# Patient Record
Sex: Female | Born: 1965 | Hispanic: No | Marital: Single | Smoking: Never smoker
Health system: Southern US, Community
[De-identification: ages and names within clinical notes are randomized; demographics above are authoritative.]

## PROBLEM LIST (undated history)

## (undated) DIAGNOSIS — Z780 Asymptomatic menopausal state: Secondary | ICD-10-CM

## (undated) HISTORY — PX: KNEE ARTHROSCOPY: SHX127

---

## 2010-07-23 ENCOUNTER — Ambulatory Visit: Payer: Self-pay | Admitting: Family Medicine

## 2010-07-23 DIAGNOSIS — M7710 Lateral epicondylitis, unspecified elbow: Secondary | ICD-10-CM | POA: Insufficient documentation

## 2010-07-23 DIAGNOSIS — K2970 Gastritis, unspecified, without bleeding: Secondary | ICD-10-CM | POA: Insufficient documentation

## 2010-07-23 DIAGNOSIS — K2990 Gastroduodenitis, unspecified, without bleeding: Secondary | ICD-10-CM

## 2010-07-24 ENCOUNTER — Encounter: Payer: Self-pay | Admitting: Family Medicine

## 2010-07-24 LAB — CONVERTED CEMR LAB
ALT: 11 units/L (ref 0–35)
AST: 15 units/L (ref 0–37)
Albumin: 3.6 g/dL (ref 3.5–5.2)
Alkaline Phosphatase: 38 units/L — ABNORMAL LOW (ref 39–117)
Basophils Absolute: 0 10*3/uL (ref 0.0–0.1)
Basophils Relative: 1 % (ref 0–1)
Calcium: 8.4 mg/dL (ref 8.4–10.5)
Chloride: 106 meq/L (ref 96–112)
Eosinophils Absolute: 0.1 10*3/uL (ref 0.0–0.7)
HCT: 40.5 % (ref 36.0–46.0)
Hemoglobin: 13.1 g/dL (ref 12.0–15.0)
Potassium: 4.7 meq/L (ref 3.5–5.3)
RDW: 12.9 % (ref 11.5–15.5)
Sodium: 140 meq/L (ref 135–145)
Total Protein: 6 g/dL (ref 6.0–8.3)
Vit D, 25-Hydroxy: 47 ng/mL (ref 30–89)

## 2010-08-11 ENCOUNTER — Encounter: Payer: Self-pay | Admitting: Family Medicine

## 2010-10-21 NOTE — Letter (Signed)
Summary: Annual Exam-[facility]  Annual Exam-[facility]   Imported By: Maryln Gottron 08/25/2010 14:36:31  _____________________________________________________________________  External Attachment:    Type:   Image     Comment:   External Document

## 2010-10-21 NOTE — Assessment & Plan Note (Signed)
Summary: NOV: Gastritis, Lateral epicondyltis   Vital Signs:  Patient profile:   45 year old female Height:      62 inches Weight:      116 pounds Temp:     98.8 degrees F oral Pulse rate:   94 / minute BP sitting:   112 / 60  (right arm) Cuff size:   regular  Vitals Entered By: Avon Gully CMA, Duncan Dull) (July 23, 2010 9:08 AM) CC: NP ---abd pain   CC:  NP ---abd pain.  History of Present Illness: Had "stomach pain " in July right before went for a cruise. Then it went away on its own.  Pain recurred about 2 days ago. Feels like an "ache".  Avoid MSG, caffeine, et because of her HA.  Occ eats spicey foods.  After ate a pancake had sharp "gashing" pains.  Then changed to a BRAT diet.  Felt nauseated the first day.  Nomral BMs, no blood in teh stool.  Pain is epigastric but no radiation. Occ will notice a burn in that area.  Does have a high tolerance for pain. Tried Pepto -bismol and didn't help.  Tried a Ginger pill and helped some.  No fever.    Habits & Providers  Alcohol-Tobacco-Diet     Alcohol drinks/day: 0     Tobacco Status: never  Exercise-Depression-Behavior     Does Patient Exercise: yes     Type of exercise: free wt lifter     Times/week: 3     STD Risk: never     Drug Use: no     Seat Belt Use: always  Current Medications (verified): 1)  Ortho Tri-Cyclen (28) 0.18/0.215/0.25 Mg-35 Mcg Tabs (Norgestim-Eth Estrad Triphasic) .... Take One Tablet By Mouth Once A Day  Allergies (verified): No Known Drug Allergies  Comments:  Nurse/Medical Assistant: The patient's medications and allergies were reviewed with the patient and were updated in the Medication and Allergy Lists. Avon Gully CMA, Duncan Dull) (July 23, 2010 9:11 AM)  Past History:  Past Medical History: None  Past Surgical History: None  Family History: Uncle with alcoholism Father wtih hi cho, HTN  Social History: Never Smoked Alcohol use-no Drug use-no Regular  exercise-yes Smoking Status:  never Does Patient Exercise:  yes STD Risk:  never Drug Use:  no Seat Belt Use:  always  Review of Systems       No fever/sweats/weakness, unexplained weight loss/gain.  No vison changes.  No difficulty hearing/ringing in ears, hay fever/allergies.  No chest pain/discomfort, palpitations.  No Br lump/nipple discharge.  No cough/wheeze.  No blood in BM, nausea/vomiting/diarrhea.  No nighttime urination, leaking urine, unusual vaginal bleeding, discharge (penis or vagina).  No muscle/joint pain. No rash, change in mole.  No HA, memory loss.  No anxiety, sleep d/o, depression.  No easy bruising/bleeding, unexplained lump   Physical Exam  General:  Well-developed,well-nourished,in no acute distress; alert,appropriate and cooperative throughout examination Lungs:  Normal respiratory effort, chest expands symmetrically. Lungs are clear to auscultation, no crackles or wheezes. Heart:  Normal rate and regular rhythm. S1 and S2 normal without gallop, murmur, click, rub or other extra sounds. Abdomen:  soft, normal bowel sounds, no distention, no hepatomegaly, and no splenomegaly.  Tender in the right upper and left upper quadrant and the left lower quadrant.  Psych:  Cognition and judgment appear intact. Alert and cooperative with normal attention span and concentration. No apparent delusions, illusions, hallucinations   Impression & Recommendations:  Problem # 1:  GASTRITIS (ICD-535.50)  Orders: T-Comprehensive Metabolic Panel [id number]) T-CBC w/Diff [id number]) T-Amylase [phone]) T-Lipase [id number])  Problem # 2:  LATERAL EPICONDYLITIS (ICD-726.32)  Complete Medication List: 1)  Ortho Tri-cyclen (28) 0.18/0.215/0.25 Mg-35 Mcg Tabs (Norgestim-eth estrad triphasic) .... Take one tablet by mouth once a day  Patient Instructions: 1)  See if the dexilant samples hep. Take them once a day in the AM. If working well then can change to the to Prilosec  OTC and Prevacid OTC.  2)  Call me Monday and let me know if better. 3)  Avoid spicey, greasy foods, and acidic foods.  Avoid Advile and Aleve products.     Orders Added: 1)  T-Comprehensive Metabolic Panel [[id number]] 2)  T-CBC w/Diff [[id number]] 3)  T-Amylase [[id number]] 4)  T-Lipase [[id number]]  Appended Document: NOV: Gastritis, Lateral epicondyltis   Appended Document: NOV: Gastritis, Lateral epicondyltis   Preventive Care Screening  Mammogram:    Date:  09/21/2009    Next Due:  09/2010    Results:  normal  Dexa Interp:    Next Due:  08/2020  Pap Smear:    Date:  08/11/2010    Next Due:  08/2012    Results:  normal

## 2010-12-08 ENCOUNTER — Encounter: Payer: Self-pay | Admitting: Family Medicine

## 2010-12-10 ENCOUNTER — Ambulatory Visit (INDEPENDENT_AMBULATORY_CARE_PROVIDER_SITE_OTHER): Payer: No Typology Code available for payment source | Admitting: Family Medicine

## 2010-12-10 VITALS — BP 97/60 | HR 51 | Ht 62.75 in | Wt 111.0 lb

## 2010-12-10 DIAGNOSIS — F411 Generalized anxiety disorder: Secondary | ICD-10-CM | POA: Insufficient documentation

## 2010-12-10 MED ORDER — FLUOXETINE HCL 20 MG PO CAPS: ORAL_CAPSULE | ORAL | Status: DC

## 2010-12-10 NOTE — Progress Notes (Signed)
  Subjective:    Patient ID: Angela Floyd, female    DOB: September 13, 1966, 45 y.o.   MRN: [id number]  Anxiety Presents for initial visit. Episode onset: 2 months. The problem has been gradually worsening.    Son was cussed out at school back in early January.  Son has recorded the conversation.  They approached the school about this. Moved her son out of school.  Went to the newspapers about it b/c no response from teh school.  Son received threats form his friends. 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  She is not sleeping or eating.  Mind is rushing. Waking up at night with her palms or sweaty.  Wants to know if anything natural that might help her.  Her husband is a quadriplegic and was on Depo for year. She actually felt this helped keep her mood even keel. Has been irritable at work. She usually get regular exercise, just has been exhausted because not sleeping well.  She has a lot of guilt about leaving her husband.      Review of Systems     Objective:   Physical Exam  Constitutional: She appears well-developed and well-nourished.  Cardiovascular: Normal rate, regular rhythm and normal heart sounds.   Pulmonary/Chest: Effort normal and breath sounds normal.          Assessment & Plan:

## 2010-12-10 NOTE — Assessment & Plan Note (Addendum)
Has acute situational stressors but has some long term issues. Discused options of counseling and regular exercise. Also dicussed medications as an options. Will start and SSRI. Discussed potential SE. Pt aware. Will make referral for counseling. F/U in 3-4 weeks.

## 2010-12-10 NOTE — Patient Instructions (Signed)
Start with half a tab once a day, then increase to whole tab daily Call me if you have any problems.

## 2010-12-24 ENCOUNTER — Ambulatory Visit (INDEPENDENT_AMBULATORY_CARE_PROVIDER_SITE_OTHER): Payer: No Typology Code available for payment source | Admitting: Licensed Clinical Social Worker

## 2010-12-24 DIAGNOSIS — F4322 Adjustment disorder with anxiety: Secondary | ICD-10-CM

## 2011-01-02 ENCOUNTER — Ambulatory Visit: Payer: Self-pay | Admitting: Family Medicine

## 2011-01-09 ENCOUNTER — Encounter (INDEPENDENT_AMBULATORY_CARE_PROVIDER_SITE_OTHER): Payer: No Typology Code available for payment source | Admitting: Licensed Clinical Social Worker

## 2011-01-09 DIAGNOSIS — F4322 Adjustment disorder with anxiety: Secondary | ICD-10-CM

## 2011-01-21 ENCOUNTER — Encounter (HOSPITAL_COMMUNITY): Payer: Self-pay | Admitting: Licensed Clinical Social Worker

## 2011-02-04 ENCOUNTER — Encounter (INDEPENDENT_AMBULATORY_CARE_PROVIDER_SITE_OTHER): Payer: No Typology Code available for payment source | Admitting: Licensed Clinical Social Worker

## 2011-02-04 DIAGNOSIS — F4323 Adjustment disorder with mixed anxiety and depressed mood: Secondary | ICD-10-CM

## 2011-04-20 ENCOUNTER — Encounter: Payer: Self-pay | Admitting: Emergency Medicine

## 2011-04-20 ENCOUNTER — Inpatient Hospital Stay (INDEPENDENT_AMBULATORY_CARE_PROVIDER_SITE_OTHER)
Admission: RE | Admit: 2011-04-20 | Discharge: 2011-04-20 | Disposition: A | Discharge status: Home / Self Care | Payer: No Typology Code available for payment source | Source: Ambulatory Visit | Attending: Emergency Medicine | Admitting: Emergency Medicine

## 2011-04-20 DIAGNOSIS — N390 Urinary tract infection, site not specified: Secondary | ICD-10-CM

## 2011-04-20 LAB — CONVERTED CEMR LAB
Bilirubin Urine: NEGATIVE
Glucose, Urine, Semiquant: NEGATIVE
Ketones, urine, test strip: NEGATIVE
Specific Gravity, Urine: 1.01
Urobilinogen, UA: 0.2

## 2011-04-23 ENCOUNTER — Telehealth (INDEPENDENT_AMBULATORY_CARE_PROVIDER_SITE_OTHER): Payer: Self-pay | Admitting: Emergency Medicine

## 2011-06-05 ENCOUNTER — Inpatient Hospital Stay (INDEPENDENT_AMBULATORY_CARE_PROVIDER_SITE_OTHER)
Admission: RE | Admit: 2011-06-05 | Discharge: 2011-06-05 | Disposition: A | Discharge status: Home / Self Care | Payer: No Typology Code available for payment source | Source: Ambulatory Visit | Attending: Emergency Medicine | Admitting: Emergency Medicine

## 2011-06-05 ENCOUNTER — Encounter: Payer: Self-pay | Admitting: Emergency Medicine

## 2011-06-05 DIAGNOSIS — J029 Acute pharyngitis, unspecified: Secondary | ICD-10-CM | POA: Insufficient documentation

## 2011-06-05 DIAGNOSIS — J040 Acute laryngitis: Secondary | ICD-10-CM | POA: Insufficient documentation

## 2011-08-24 NOTE — Telephone Encounter (Signed)
  Phone Note Outgoing Call Call back at Home Phone [phone] [facility]     Call placed by: Emilio Math,  April 23, 2011 11:12 AM Call placed to: Patient Summary of Call: Patient has been drinking a lot of water using AZO and is asymptomatic.  Advised her that the culture was negative.

## 2011-08-24 NOTE — Progress Notes (Signed)
Summary: ?uti Room 5   Vital Signs:  Patient Profile:   45 Years Old Female CC:      Polyuria, dysuria x 5 days LMP:     03/30/2011 Height:     62 inches Weight:      115 pounds O2 Sat:      99 % O2 treatment:    Room Air Temp:     98.4 degrees F oral Pulse rate:   66 / minute Pulse rhythm:   regular Resp:     14 per minute BP sitting:   92 / 56  (left arm) Cuff size:   regular  Vitals Entered By: Emilio Math (April 20, 2011 8:26 AM)  Menstrual History: LMP (date): 03/30/2011                  Current Allergies: No known allergies History of Present Illness History from: patient Chief Complaint: Polyuria, dysuria x 5 days History of Present Illness: 45 Years Old Female complains of UTI symptoms for 2 days.  She describes the pain as burning during urination.  She has not used any OTC meds. + dysuria + frequency + urgency No hematuria No vaginal discharge No fever/chills No lower abdomenal pain No back pain No fatigue   REVIEW OF SYSTEMS Constitutional Symptoms      Denies fever, chills, night sweats, weight loss, weight gain, and fatigue.  Eyes       Denies change in vision, eye pain, eye discharge, glasses, contact lenses, and eye surgery. Ear/Nose/Throat/Mouth       Denies hearing loss/aids, change in hearing, ear pain, ear discharge, dizziness, frequent runny nose, frequent nose bleeds, sinus problems, sore throat, hoarseness, and tooth pain or bleeding.  Respiratory       Denies dry cough, productive cough, wheezing, shortness of breath, asthma, bronchitis, and emphysema/COPD.  Cardiovascular       Denies murmurs, chest pain, and tires easily with exhertion.    Gastrointestinal       Denies stomach pain, nausea/vomiting, diarrhea, constipation, blood in bowel movements, and indigestion. Genitourniary       Complains of painful urination.      Denies kidney stones and loss of urinary control. Neurological       Denies paralysis, seizures, and  fainting/blackouts. Musculoskeletal       Denies muscle pain, joint pain, joint stiffness, decreased range of motion, redness, swelling, muscle weakness, and gout.  Skin       Denies bruising, unusual mles/lumps or sores, and hair/skin or nail changes.  Psych       Denies mood changes, temper/anger issues, anxiety/stress, speech problems, depression, and sleep problems.  Past History:  Past Medical History: Reviewed history from 07/23/2010 and no changes required. None  Past Surgical History: Reviewed history from 07/23/2010 and no changes required. None  Family History: Reviewed history from 07/23/2010 and no changes required. Uncle with alcoholism Father wtih hi cho, HTN  Social History: Reviewed history from 07/23/2010 and no changes required. Never Smoked Alcohol use-no Drug use-no Regular exercise-yes Physical Exam General appearance: well developed, well nourished, no acute distress Abdomen: soft, non-tender without obvious organomegaly Back: no flank of CVA tenderness MSE: oriented to time, place, and person Assessment New Problems: URINARY TRACT INFECTION (ICD-599.0)   Patient Education: Patient and/or caregiver instructed in the following: rest fluids and Tylenol.  Plan New Medications/Changes: CIPROFLOXACIN HCL 250 MG TABS (CIPROFLOXACIN HCL) 1 by mouth two times a day for 5 days  #10 x 0, 04/20/2011,  Hoyt Koch MD  New Orders: New Patient Level III [phone] UA Dipstick w/o Micro (automated)  [81003] T-Culture, Urine [id number] Planning Comments:   Urine culture pending.  Pt states that she may hold off on the antibiotics for a day or two and see if OTC Azo and hydration work since she is concerned with the birth control interaction.  Follow-up with your primary care physician if not improving or if getting worse.   The patient and/or caregiver has been counseled thoroughly with regard to medications prescribed including dosage, schedule,  interactions, rationale for use, and possible side effects and they verbalize understanding.  Diagnoses and expected course of recovery discussed and will return if not improved as expected or if the condition worsens. Patient and/or caregiver verbalized understanding.  Prescriptions: CIPROFLOXACIN HCL 250 MG TABS (CIPROFLOXACIN HCL) 1 by mouth two times a day for 5 days  #10 x 0   Entered and Authorized by:   Hoyt Koch MD   Signed by:   Hoyt Koch MD on 04/20/2011   Method used:   Print then Give to Patient   RxID:   [id number]   Orders Added: 1)  New Patient Level III [95621] 2)  UA Dipstick w/o Micro (automated)  [81003] 3)  T-Culture, Urine [[id number]]    Laboratory Results   Urine Tests  Date/Time Received: April 20, 2011 8:32 AM  Date/Time Reported: April 20, 2011 8:32 AM   Routine Urinalysis   Color: yellow Appearance: Clear Glucose: negative   (Normal Range: Negative) Bilirubin: negative   (Normal Range: Negative) Ketone: negative   (Normal Range: Negative) Spec. Gravity: 1.010   (Normal Range: 1.003-1.035) Blood: 2+   (Normal Range: Negative) pH: 6.0   (Normal Range: 5.0-8.0) Protein: negative   (Normal Range: Negative) Urobilinogen: 0.2   (Normal Range: 0-1) Nitrite: negative   (Normal Range: Negative) Leukocyte Esterace: 1+   (Normal Range: Negative)

## 2011-08-24 NOTE — Progress Notes (Signed)
Summary: severe sore throat rm 2   Vital Signs:  Patient Profile:   45 Years Old Female CC:      HA, hoarseness, teeth and face hurts x 5 days Height:     62 inches Weight:      115.75 pounds O2 Sat:      99 % O2 treatment:    Room Air Temp:     98.8 degrees F oral Pulse rate:   69 / minute Resp:     16 per minute BP sitting:   99 / 62  (left arm) Cuff size:   regular  Vitals Entered By: Clemens Catholic LPN (June 05, 2011 10:10 AM)                  Updated Prior Medication List: ORTHO TRI-CYCLEN (28) 0.18/0.215/0.25 MG-35 MCG TABS (NORGESTIM-ETH ESTRAD TRIPHASIC) take one tablet by mouth once a day  Current Allergies (reviewed today): No known allergies History of Present Illness Chief Complaint: HA, hoarseness, teeth and face hurts x 5 days History of Present Illness: 45 Years Old Female complains of onset of cold symptoms for 4-5 days.  Nemesis has been using Mucinex-D which is helping a little bit. + sore throat + cough No pleuritic pain No wheezing + nasal congestion + post-nasal drainage + sinus pain/pressure + hoarseness No chest congestion No itchy/red eyes No earache No hemoptysis No SOB No chills/sweats No fever No nausea No vomiting No abdominal pain No diarrhea No skin rashes +fatigue + myalgias + headache   REVIEW OF SYSTEMS Constitutional Symptoms       Complains of chills and night sweats.     Denies fever, weight loss, weight gain, and fatigue.  Eyes       Denies change in vision, eye pain, eye discharge, glasses, contact lenses, and eye surgery. Ear/Nose/Throat/Mouth       Complains of sinus problems and hoarseness.      Denies hearing loss/aids, change in hearing, ear pain, ear discharge, dizziness, frequent runny nose, frequent nose bleeds, sore throat, and tooth pain or bleeding.  Respiratory       Complains of dry cough.      Denies productive cough, wheezing, shortness of breath, asthma, bronchitis, and emphysema/COPD.    Cardiovascular       Complains of tires easily with exhertion.      Denies murmurs and chest pain.    Gastrointestinal       Denies stomach pain, nausea/vomiting, diarrhea, constipation, blood in bowel movements, and indigestion. Genitourniary       Denies painful urination, kidney stones, and loss of urinary control. Neurological       Complains of headaches.      Denies paralysis, seizures, and fainting/blackouts. Musculoskeletal       Denies muscle pain, joint pain, joint stiffness, decreased range of motion, redness, swelling, muscle weakness, and gout.  Skin       Denies bruising, unusual mles/lumps or sores, and hair/skin or nail changes.  Psych       Denies mood changes, temper/anger issues, anxiety/stress, speech problems, depression, and sleep problems. Other Comments: pt c/o HA, hoarseness, teeth and face hurts x 5 days. she has taken IBF and Mucinex D.   Past History:  Past Medical History: Reviewed history from 07/23/2010 and no changes required. None  Past Surgical History: breast augmentation  Family History: Reviewed history from 07/23/2010 and no changes required. Uncle with alcoholism Father wtih hi cho, HTN  Social History: Reviewed history from  07/23/2010 and no changes required. Never Smoked Alcohol use-no Drug use-no Regular exercise-yes Physical Exam General appearance: well developed, well nourished, hoarse voice Ears: normal, no lesions or deformities Nasal: mucosa pink, nonedematous, no septal deviation, turbinates normal Oral/Pharynx: clear PND, no erythema, no exudate Chest/Lungs: no rales, wheezes, or rhonchi bilateral, breath sounds equal without effort Heart: regular rate and  rhythm, no murmur MSE: oriented to time, place, and person Assessment New Problems: ACUTE LARYNGITIS, WITHOUT MENTION OF OBSTRUCTIO (ICD-464.00) ACUTE PHARYNGITIS (ICD-462)   Plan New Medications/Changes: ZITHROMAX Z-PAK 250 MG TABS (AZITHROMYCIN) use as  directed  #1 x 0, 06/05/2011, Hoyt Koch MD PREDNISONE 20 MG TABS (PREDNISONE) 1 by mouth two times a day for 3 days  #6 x 0, 06/05/2011, Hoyt Koch MD  New Orders: Est. Patient Level III [phone] Planning Comments:   1)  Take the prescribed antibiotic as instructed.  Hold for a few days unless worsening.  Take the prednisone first.  Rest voice, hydrate.  Likely is viral laryngitis/pharyngitis still. 2)  Use nasal saline solution (over the counter) at least 3 times a day. 3)  Use over the counter decongestants like Zyrtec-D every 12 hours as needed to help with congestion. 4)  Can take tylenol every 6 hours or motrin every 8 hours for pain or fever. 5)  Follow up with your primary doctor  if no improvement in 5-7 days, sooner if increasing pain, fever, or new symptoms.    The patient and/or caregiver has been counseled thoroughly with regard to medications prescribed including dosage, schedule, interactions, rationale for use, and possible side effects and they verbalize understanding.  Diagnoses and expected course of recovery discussed and will return if not improved as expected or if the condition worsens. Patient and/or caregiver verbalized understanding.  Prescriptions: ZITHROMAX Z-PAK 250 MG TABS (AZITHROMYCIN) use as directed  #1 x 0   Entered and Authorized by:   Hoyt Koch MD   Signed by:   Hoyt Koch MD on 06/05/2011   Method used:   Print then Give to Patient   RxID:   [id number] PREDNISONE 20 MG TABS (PREDNISONE) 1 by mouth two times a day for 3 days  #6 x 0   Entered and Authorized by:   Hoyt Koch MD   Signed by:   Hoyt Koch MD on 06/05/2011   Method used:   Print then Give to Patient   RxID:   [id number]   Orders Added: 1)  Est. Patient Level III [13244]

## 2012-01-10 ENCOUNTER — Emergency Department
Admission: EM | Admit: 2012-01-10 | Discharge: 2012-01-10 | Disposition: A | Discharge status: Home / Self Care | Payer: No Typology Code available for payment source | Source: Home / Self Care | Attending: Family Medicine | Admitting: Family Medicine

## 2012-01-10 ENCOUNTER — Encounter: Payer: Self-pay | Admitting: *Deleted

## 2012-01-10 DIAGNOSIS — N3000 Acute cystitis without hematuria: Secondary | ICD-10-CM

## 2012-01-10 LAB — POCT URINALYSIS DIP (MANUAL ENTRY)
Glucose, UA: NEGATIVE
Ketones, POC UA: NEGATIVE
Protein Ur, POC: NEGATIVE

## 2012-01-10 MED ORDER — SULFAMETHOXAZOLE-TRIMETHOPRIM 800-160 MG PO TABS: 1.0000 | ORAL_TABLET | Freq: Two times a day (BID) | ORAL | Status: AC

## 2012-01-10 NOTE — ED Provider Notes (Signed)
History     CSN: [id number]  Arrival date & time 01/10/12  1605   First MD Initiated Contact with Patient 01/10/12 1628      Chief Complaint  Patient presents with  . Urinary Tract Infection     HPI Comments: Patient complains of onset of dysuria/frequency about 5 hours ago.  She feels well otherwise.  Patient's last menstrual period was 12/13/2011.  No abdominal or pelvic pain.  No fevers, chills, and sweats   Patient is a 46 y.o. female presenting with dysuria. The history is provided by the patient.  Dysuria  This is a new problem. The current episode started 3 to 5 hours ago. The problem occurs every urination. The problem has been gradually worsening. The quality of the pain is described as burning. The pain is mild. There has been no fever. She is sexually active. Associated symptoms include chills, frequency, hesitancy and urgency. Pertinent negatives include no sweats, no nausea, no vomiting, no discharge, no hematuria, no possible pregnancy and no flank pain. She has tried nothing for the symptoms.    History reviewed. No pertinent past medical history.  History reviewed. No pertinent past surgical history.  Family History  Problem Relation Age of Onset  . Hyperlipidemia Father   . Hypertension Father   . Alcohol abuse Other     History  Substance Use Topics  . Smoking status: Never Smoker   . Smokeless tobacco: Not on file  . Alcohol Use: No    OB History    Grav Para Term Preterm Abortions TAB SAB Ect Mult Living                  Review of Systems  Constitutional: Positive for chills.  Gastrointestinal: Negative for nausea and vomiting.  Genitourinary: Positive for dysuria, hesitancy, urgency and frequency. Negative for hematuria and flank pain.  All other systems reviewed and are negative.    Allergies  Review of patient's allergies indicates no known allergies.  Home Medications   Current Outpatient Rx  Name Route Sig Dispense Refill  .  FLUOXETINE HCL 20 MG PO CAPS  20mg  tablet.  Take 1/2 tab by mouth once a day for one week, then increase to whole tab daily. 30 capsule 0  . NORGESTIM-ETH ESTRAD TRIPHASIC 0.18/0.215/0.25 MG-35 MCG PO TABS Oral Take 1 tablet by mouth daily.      . SULFAMETHOXAZOLE-TRIMETHOPRIM 800-160 MG PO TABS Oral Take 1 tablet by mouth 2 (two) times daily. 10 tablet 0    BP 127/71  Pulse 63  Temp(Src) 98.4 F (36.9 C) (Oral)  Resp 16  Ht 5\' 2"  (1.575 m)  Wt 121 lb 12 oz (55.225 kg)  BMI 22.27 kg/m2  SpO2 100%  LMP 12/13/2011  Physical Exam Nursing notes and Vital Signs reviewed. Appearance:  Patient appears healthy, stated age, and in no acute distress Eyes:  Pupils are equal, round, and reactive to light and accomodation.  Extraocular movement is intact.  Conjunctivae are not inflamed  Pharynx:  Normal;  moist mucous membranes  Neck:  Supple.   No adenopathy Lungs:  Clear to auscultation.  Breath sounds are equal.  Heart:  Regular rate and rhythm without murmurs, rubs, or gallops.  Abdomen:  Nontender without masses or hepatosplenomegaly.  Bowel sounds are present.  No CVA or flank tenderness.  Extremities:  No edema.  No calf tenderness Skin:  No rash present.   ED Course  Procedures none   Labs Reviewed  POCT URINALYSIS DIP (MANUAL  ENTRY) moderate blood, trace leuks, otherwise negative  URINE CULTURE pending      1. Acute cystitis       MDM  Urine culture pending.  Begin Septra DS for 5 days.   Continue increased fluid intake.  May take Azo for two days. Followup with Family Doctor if not improved in 5 days.        Lattie Haw, MD 01/10/12 [phone]

## 2012-01-10 NOTE — ED Notes (Signed)
Pt started having dysuria today described as burning and pressure.

## 2012-01-10 NOTE — Discharge Instructions (Signed)
Continue increased fluid intake.  May take Azo for two days.   Urinary Tract Infection Infections of the urinary tract can start in several places. A bladder infection (cystitis), a kidney infection (pyelonephritis), and a prostate infection (prostatitis) are different types of urinary tract infections (UTIs). They usually get better if treated with medicines (antibiotics) that kill germs. Take all the medicine until it is gone. You or your child may feel better in a few days, but TAKE ALL MEDICINE or the infection may not respond and may become more difficult to treat. HOME CARE INSTRUCTIONS   Drink enough water and fluids to keep the urine clear or pale yellow. Cranberry juice is especially recommended, in addition to large amounts of water.   Avoid caffeine, tea, and carbonated beverages. They tend to irritate the bladder.   Alcohol may irritate the prostate.   Only take over-the-counter or prescription medicines for pain, discomfort, or fever as directed by your caregiver.  To prevent further infections:  Empty the bladder often. Avoid holding urine for long periods of time.   After a bowel movement, women should cleanse from front to back. Use each tissue only once.   Empty the bladder before and after sexual intercourse.  FINDING OUT THE RESULTS OF YOUR TEST Not all test results are available during your visit. If your or your child's test results are not back during the visit, make an appointment with your caregiver to find out the results. Do not assume everything is normal if you have not heard from your caregiver or the medical facility. It is important for you to follow up on all test results. SEEK MEDICAL CARE IF:   There is back pain.   Your baby is older than 3 months with a rectal temperature of 100.5 F (38.1 C) or higher for more than 1 day.   Your or your child's problems (symptoms) are no better in 3 days. Return sooner if you or your child is getting worse.  SEEK  IMMEDIATE MEDICAL CARE IF:   There is severe back pain or lower abdominal pain.   You or your child develops chills.   You have a fever.   Your baby is older than 3 months with a rectal temperature of 102 F (38.9 C) or higher.   Your baby is 18 months old or younger with a rectal temperature of 100.4 F (38 C) or higher.   There is nausea or vomiting.   There is continued burning or discomfort with urination.  MAKE SURE YOU:   Understand these instructions.   Will watch your condition.   Will get help right away if you are not doing well or get worse.  Document Released: 06/17/2005 Document Revised: 08/27/2011 Document Reviewed: 01/20/2007 [facility] Patient Information 2012 [place], [place].

## 2012-01-12 LAB — URINE CULTURE: Colony Count: >=100000

## 2012-01-14 ENCOUNTER — Telehealth: Payer: Self-pay | Admitting: *Deleted

## 2015-10-06 ENCOUNTER — Emergency Department
Admission: EM | Admit: 2015-10-06 | Discharge: 2015-10-06 | Disposition: A | Discharge status: Home / Self Care | Payer: BLUE CROSS/BLUE SHIELD | Source: Home / Self Care | Attending: Family Medicine | Admitting: Family Medicine

## 2015-10-06 DIAGNOSIS — J0100 Acute maxillary sinusitis, unspecified: Secondary | ICD-10-CM

## 2015-10-06 MED ORDER — AMOXICILLIN-POT CLAVULANATE 875-125 MG PO TABS: 1.0000 | ORAL_TABLET | Freq: Two times a day (BID) | ORAL | Status: DC

## 2015-10-06 NOTE — Discharge Instructions (Signed)
You may take 400-600mg Ibuprofen (Motrin) every 6-8 hours for fever and pain  °Alternate with Tylenol  °You may take 500mg Tylenol every 4-6 hours as needed for fever and pain  °Follow-up with your primary care provider next week for recheck of symptoms if not improving.  °Be sure to drink plenty of fluids and rest, at least 8hrs of sleep a night, preferably more while you are sick. °Return urgent care or go to closest ER if you cannot keep down fluids/signs of dehydration, fever not reducing with Tylenol, difficulty breathing/wheezing, stiff neck, worsening condition, or other concerns (see below)  °Please take antibiotics as prescribed and be sure to complete entire course even if you start to feel better to ensure infection does not come back. ° ° °Sinusitis, Adult °Sinusitis is redness, soreness, and inflammation of the paranasal sinuses. Paranasal sinuses are air pockets within the bones of your face. They are located beneath your eyes, in the middle of your forehead, and above your eyes. In healthy paranasal sinuses, mucus is able to drain out, and air is able to circulate through them by way of your nose. However, when your paranasal sinuses are inflamed, mucus and air can become trapped. This can allow bacteria and other germs to grow and cause infection. °Sinusitis can develop quickly and last only a short time (acute) or continue over a long period (chronic). Sinusitis that lasts for more than 12 weeks is considered chronic. °CAUSES °Causes of sinusitis include: °· Allergies. °· Structural abnormalities, such as displacement of the cartilage that separates your nostrils (deviated septum), which can decrease the air flow through your nose and sinuses and affect sinus drainage. °· Functional abnormalities, such as when the small hairs (cilia) that line your sinuses and help remove mucus do not work properly or are not present. °SIGNS AND SYMPTOMS °Symptoms of acute and chronic sinusitis are the same. The  primary symptoms are pain and pressure around the affected sinuses. Other symptoms include: °· Upper toothache. °· Earache. °· Headache. °· Bad breath. °· Decreased sense of smell and taste. °· A cough, which worsens when you are lying flat. °· Fatigue. °· Fever. °· Thick drainage from your nose, which often is green and may contain pus (purulent). °· Swelling and warmth over the affected sinuses. °DIAGNOSIS °Your health care provider will perform a physical exam. During your exam, your health care provider may perform any of the following to help determine if you have acute sinusitis or chronic sinusitis: °· Look in your nose for signs of abnormal growths in your nostrils (nasal polyps). °· Tap over the affected sinus to check for signs of infection. °· View the inside of your sinuses using an imaging device that has a light attached (endoscope). °If your health care provider suspects that you have chronic sinusitis, one or more of the following tests may be recommended: °· Allergy tests. °· Nasal culture. A sample of mucus is taken from your nose, sent to a lab, and screened for bacteria. °· Nasal cytology. A sample of mucus is taken from your nose and examined by your health care provider to determine if your sinusitis is related to an allergy. °TREATMENT °Most cases of acute sinusitis are related to a viral infection and will resolve on their own within 10 days. Sometimes, medicines are prescribed to help relieve symptoms of both acute and chronic sinusitis. These may include pain medicines, decongestants, nasal steroid sprays, or saline sprays. °However, for sinusitis related to a bacterial infection, your health   care provider will prescribe antibiotic medicines. These are medicines that will help kill the bacteria causing the infection. °Rarely, sinusitis is caused by a fungal infection. In these cases, your health care provider will prescribe antifungal medicine. °For some cases of chronic sinusitis, surgery  is needed. Generally, these are cases in which sinusitis recurs more than 3 times per year, despite other treatments. °HOME CARE INSTRUCTIONS °· Drink plenty of water. Water helps thin the mucus so your sinuses can drain more easily. °· Use a humidifier. °· Inhale steam 3-4 times a day (for example, sit in the bathroom with the shower running). °· Apply a warm, moist washcloth to your face 3-4 times a day, or as directed by your health care provider. °· Use saline nasal sprays to help moisten and clean your sinuses. °· Take medicines only as directed by your health care provider. °· If you were prescribed either an antibiotic or antifungal medicine, finish it all even if you start to feel better. °SEEK IMMEDIATE MEDICAL CARE IF: °· You have increasing pain or severe headaches. °· You have nausea, vomiting, or drowsiness. °· You have swelling around your face. °· You have vision problems. °· You have a stiff neck. °· You have difficulty breathing. °  °This information is not intended to replace advice given to you by your health care provider. Make sure you discuss any questions you have with your health care provider. °  °Document Released: 09/07/2005 Document Revised: 09/28/2014 Document Reviewed: 09/22/2011 °Elsevier Interactive Patient Education ©2016 Elsevier Inc. ° °Sinus Rinse °WHAT IS A SINUS RINSE? °A sinus rinse is a home treatment. It rinses your sinuses with a mixture of salt and water (saline solution). Sinuses are air-filled spaces in your skull behind the bones of your face and forehead. They open into your nasal cavity. °To do a sinus rinse, you will need: °· Saline solution. °· Neti pot or spray bottle. This releases the saline solution into your nose and through your sinuses. You can buy neti pots and spray bottles at: °¨ Your local pharmacy. °¨ A health food store. °¨ Online. °WHEN WOULD I DO A SINUS RINSE?  °A sinus rinse can help to clear your nasal cavity. It can clear:   °· Mucus. °· Dirt. °· Dust. °· Pollen. °You may do a sinus rinse when you have: °· A cold. °· A virus. °· Allergies. °· A sinus infection. °· A stuffy nose. °If you are considering a sinus rinse: °· Ask your child's doctor before doing a sinus rinse on your child. °· Do not do a sinus rinse if you have had: °¨ Ear or nasal surgery. °¨ An ear infection. °¨ Blocked ears. °HOW DO I DO A SINUS RINSE?  °· Wash your hands. °· Disinfect your device using the directions that came with the device. °· Dry your device. °· Use the solution that comes with your device or one that is sold separately in stores. Follow the mixing directions on the package. °· Fill your device with the amount of saline solution as stated in the device instructions. °· Stand over a sink and tilt your head sideways over the sink. °· Place the spout of the device in your upper nostril (the one closer to the ceiling). °· Gently pour or squeeze the saline solution into the nasal cavity. The liquid should drain to the lower nostril if you are not too congested. °· Gently blow your nose. Blowing too hard may cause ear pain. °· Repeat in the other   nostril. °· Clean and rinse your device with clean water. °· Air-dry your device. °ARE THERE RISKS OF A SINUS RINSE?  °Sinus rinse is normally very safe and helpful. However, there are a few risks, which include:  °· A burning feeling in the sinuses. This may happen if you do not make the saline solution as instructed. Make sure to follow all directions when making the saline solution. °· Infection from unclean water. This is rare, but possible. °· Nasal irritation. °  °This information is not intended to replace advice given to you by your health care provider. Make sure you discuss any questions you have with your health care provider. °  °Document Released: 04/04/2014 Document Reviewed: 04/04/2014 °Elsevier Interactive Patient Education ©2016 Elsevier Inc. ° ° °

## 2015-10-06 NOTE — ED Provider Notes (Signed)
CSN: [id number]     Arrival date & time 10/06/15  1607 History   First MD Initiated Contact with Patient 10/06/15 1629     Chief Complaint  Patient presents with  . Facial Pain   (Consider location/radiation/quality/duration/timing/severity/associated sxs/prior Treatment) HPI Pt is a 50yo [facility] c/o worsening URI symptoms over the last 1 week but states she has had intermittent URI symptoms for 3-4 weeks.  Pt states she had a mild intermittent cough which congestion that slowly improved with taking vitamins, keeping well hydrated and using a sinus rinse but now she is experiencing severe frontal headaches with moderate amount of nasal congestion and tooth pain.  She has mild bilateral ear pressure. She has had night sweats and generalized fatigue.  Denies n/v/d.   History reviewed. No pertinent past medical history. Past Surgical History  Procedure Laterality Date  . Knee arthroscopy Left    Family History  Problem Relation Age of Onset  . Hyperlipidemia Father   . Hypertension Father   . Alcohol abuse Other    Social History  Substance Use Topics  . Smoking status: Never Smoker   . Smokeless tobacco: None  . Alcohol Use: No   OB History    No data available     Review of Systems  Constitutional: Positive for diaphoresis and fatigue. Negative for fever and chills.  HENT: Positive for congestion, ear pain, postnasal drip, rhinorrhea, sinus pressure, sneezing and voice change. Negative for sore throat and trouble swallowing.   Respiratory: Positive for cough. Negative for shortness of breath.   Cardiovascular: Negative for chest pain and palpitations.  Gastrointestinal: Negative for nausea, vomiting, abdominal pain and diarrhea.  Musculoskeletal: Negative for myalgias, back pain and arthralgias.  Skin: Negative for rash.    Allergies  Review of patient's allergies indicates no known allergies.  Home Medications   Prior to Admission medications    Medication Sig Start Date End Date Taking? Authorizing Provider  amoxicillin-clavulanate (AUGMENTIN) 875-125 MG tablet Take 1 tablet by mouth 2 (two) times daily. One po bid x 7 days 10/06/15   Junius FinnerErin O'Malley, PA-C  FLUoxetine (PROZAC) 20 MG capsule 20mg  tablet.  Take 1/2 tab by mouth once a day for one week, then increase to whole tab daily. 12/10/10   Agapito Gamesatherine D Metheney, MD  Norgestim-Eth Charlott HollerEstrad Triphasic (ORTHO TRI-CYCLEN, 28,) 0.18/0.215/0.25 MG-35 MCG TABS Take 1 tablet by mouth daily.      Historical Provider, MD   Meds Ordered and Administered this Visit  Medications - No data to display  BP 102/67 mmHg  Pulse 69  Temp(Src) 97.9 F (36.6 C) (Oral)  Ht 5\' 2"  (1.575 m)  Wt 122 lb 8 oz (55.566 kg)  BMI 22.40 kg/m2  SpO2 99%  LMP  No data found.   Physical Exam  Constitutional: She appears well-developed and well-nourished. No distress.  HENT:  Head: Normocephalic and atraumatic.  Right Ear: Hearing, tympanic membrane, external ear and ear canal normal.  Left Ear: Hearing, tympanic membrane, external ear and ear canal normal.  Nose: Mucosal edema present. Right sinus exhibits maxillary sinus tenderness. Right sinus exhibits no frontal sinus tenderness. Left sinus exhibits maxillary sinus tenderness. Left sinus exhibits no frontal sinus tenderness.  Mouth/Throat: Uvula is midline, oropharynx is clear and moist and mucous membranes are normal.  Eyes: Conjunctivae are normal. No scleral icterus.  Neck: Normal range of motion. Neck supple.  Cardiovascular: Normal rate, regular rhythm and normal heart sounds.   Pulmonary/Chest: Effort normal and  breath sounds normal. No stridor. No respiratory distress. She has no wheezes. She has no rales. She exhibits no tenderness.  Abdominal: Soft. She exhibits no distension. There is no tenderness.  Musculoskeletal: Normal range of motion.  Lymphadenopathy:    She has no cervical adenopathy.  Neurological: She is alert.  Skin: Skin is warm  and dry. She is not diaphoretic.  Nursing note and vitals reviewed.   ED Course  Procedures (including critical care time)  Labs Review Labs Reviewed - No data to display  Imaging Review No results found.    MDM   1. Acute maxillary sinusitis, recurrence not specified    Pt c/o intermittent URI symptoms for 3-4 weeks, worsening sinus congestion and pain for 1 week.  Maxillary sinus tenderness on exam.  Rx: augmentin   Advised pt to use acetaminophen and ibuprofen as needed for fever and pain. Encouraged rest and fluids. F/u with PCP in 7-10 days if not improving, sooner if worsening. Pt verbalized understanding and agreement with tx plan.     Junius Finner, PA-C 10/06/15 1744

## 2015-10-06 NOTE — ED Notes (Signed)
Started with cold week before Christmas, and sinus pain and pressure started a couplde of days ago.  Pressure above and below the eyes, teeth pain.

## 2015-11-30 ENCOUNTER — Encounter: Payer: Self-pay | Admitting: Emergency Medicine

## 2015-11-30 ENCOUNTER — Emergency Department
Admission: EM | Admit: 2015-11-30 | Discharge: 2015-11-30 | Disposition: A | Discharge status: Home / Self Care | Payer: BLUE CROSS/BLUE SHIELD | Source: Home / Self Care | Attending: Family Medicine | Admitting: Family Medicine

## 2015-11-30 DIAGNOSIS — R05 Cough: Secondary | ICD-10-CM

## 2015-11-30 DIAGNOSIS — R059 Cough, unspecified: Secondary | ICD-10-CM

## 2015-11-30 DIAGNOSIS — J069 Acute upper respiratory infection, unspecified: Secondary | ICD-10-CM

## 2015-11-30 MED ORDER — BENZONATATE 100 MG PO CAPS: 100.0000 mg | ORAL_CAPSULE | Freq: Three times a day (TID) | ORAL | Status: DC

## 2015-11-30 MED ORDER — AZITHROMYCIN 250 MG PO TABS
250.0000 mg | ORAL_TABLET | Freq: Every day | ORAL | Status: DC
Start: 2015-11-30 — End: 2015-12-11

## 2015-11-30 NOTE — ED Provider Notes (Signed)
CSN: [id number]     Arrival date & time 11/30/15  1251 History   First MD Initiated Contact with Patient 11/30/15 1421     Chief Complaint  Patient presents with  . Cough   (Consider location/radiation/quality/duration/timing/severity/associated sxs/prior Treatment) HPI The pt is a 50yo female presenting to [facility] with c/o 2 weeks of gradually worsening moderately productive cough with generalized fatigue. She reports coughing up occasion thick yellow-brown mucous.  She has been using OTC mucinex-dm but no relief of symptoms. Several other people around her have also been sick. Denies fever, chills, n/v/d. Denies hx of asthma.   History reviewed. No pertinent past medical history. Past Surgical History  Procedure Laterality Date  . Knee arthroscopy Left    Family History  Problem Relation Age of Onset  . Hyperlipidemia Father   . Hypertension Father   . Alcohol abuse Other    Social History  Substance Use Topics  . Smoking status: Never Smoker   . Smokeless tobacco: None  . Alcohol Use: No   OB History    No data available     Review of Systems  Constitutional: Positive for fatigue. Negative for fever and chills.  HENT: Positive for congestion, rhinorrhea and voice change. Negative for ear pain, sore throat and trouble swallowing.   Respiratory: Positive for cough. Negative for shortness of breath.   Cardiovascular: Negative for chest pain and palpitations.  Gastrointestinal: Negative for nausea, vomiting, abdominal pain and diarrhea.  Musculoskeletal: Negative for myalgias, back pain and arthralgias.  Skin: Negative for rash.    Allergies  Review of patient's allergies indicates no known allergies.  Home Medications   Prior to Admission medications   Medication Sig Start Date End Date Taking? Authorizing Provider  amoxicillin-clavulanate (AUGMENTIN) 875-125 MG tablet Take 1 tablet by mouth 2 (two) times daily. One po bid x 7 days 10/06/15   Junius Finner, PA-C   azithromycin (ZITHROMAX) 250 MG tablet Take 1 tablet (250 mg total) by mouth daily. Take first 2 tablets together, then 1 every day until finished. 11/30/15   Junius Finner, PA-C  benzonatate (TESSALON) 100 MG capsule Take 1-2 capsules (100-200 mg total) by mouth every 8 (eight) hours. 11/30/15   Junius Finner, PA-C  FLUoxetine (PROZAC) 20 MG capsule  tablet.  Take 1/2 tab by mouth once a day for one week, then increase to whole tab daily. 12/10/10   Agapito Games, MD  Norgestim-Eth Charlott Holler Triphasic (ORTHO TRI-CYCLEN, 28,) 0.18/0.215/0.25 MG-35 MCG TABS Take 1 tablet by mouth daily.      Historical Provider, MD   Meds Ordered and Administered this Visit  Medications - No data to display  BP 100/64 mmHg  Pulse 62  Temp(Src) 98.1 F (36.7 C) (Oral)  Wt 116 lb (52.617 kg)  SpO2 99%  LMP 11/30/2015 No data found.   Physical Exam  Constitutional: She appears well-developed and well-nourished. No distress.  HENT:  Head: Normocephalic and atraumatic.  Right Ear: Tympanic membrane normal.  Left Ear: Tympanic membrane normal.  Nose: Rhinorrhea present.  Mouth/Throat: Uvula is midline, oropharynx is clear and moist and mucous membranes are normal.  Eyes: Conjunctivae are normal. No scleral icterus.  Neck: Normal range of motion.  Cardiovascular: Normal rate, regular rhythm and normal heart sounds.   Pulmonary/Chest: Effort normal. No respiratory distress. She has no wheezes. She has rhonchi. She has no rales.  Faint diffuse rhonchi, clears with cough. No wheeze.   Abdominal: Soft. She exhibits no distension. There is no tenderness.  Musculoskeletal: Normal range of motion.  Neurological: She is alert.  Skin: Skin is warm and dry. She is not diaphoretic.  Nursing note and vitals reviewed.   ED Course  Procedures (including critical care time)  Labs Review Labs Reviewed - No data to display  Imaging Review No results found.    MDM   1. Acute upper respiratory  infection   2. Cough    Worsening productive cough. Will cover for atypical bacteria  Rx: azithromycin and tessalon  Advised pt to use acetaminophen and ibuprofen as needed for fever and pain. Encouraged rest and fluids. F/u with PCP in 7-10 days if not improving, sooner if worsening. Pt verbalized understanding and agreement with tx plan.     Junius Finnerrin O'Malley, PA-C 11/30/15 [phone]

## 2015-11-30 NOTE — ED Notes (Signed)
Pt c/o cough with mucous, chest congestion and fatigue x2 weeks. Afebrile

## 2015-11-30 NOTE — Discharge Instructions (Signed)
You may take 400-600mg Ibuprofen (Motrin) every 6-8 hours for fever and pain  °Alternate with Tylenol  °You may take 500mg Tylenol every 4-6 hours as needed for fever and pain  °Follow-up with your primary care provider next week for recheck of symptoms if not improving.  °Be sure to drink plenty of fluids and rest, at least 8hrs of sleep a night, preferably more while you are sick. °Return urgent care or go to closest ER if you cannot keep down fluids/signs of dehydration, fever not reducing with Tylenol, difficulty breathing/wheezing, stiff neck, worsening condition, or other concerns (see below)  °Please take antibiotics as prescribed and be sure to complete entire course even if you start to feel better to ensure infection does not come back. ° ° °Cool Mist Vaporizers °Vaporizers may help relieve the symptoms of a cough and cold. They add moisture to the air, which helps mucus to become thinner and less sticky. This makes it easier to breathe and cough up secretions. Cool mist vaporizers do not cause serious burns like hot mist vaporizers, which may also be called steamers or humidifiers. Vaporizers have not been proven to help with colds. You should not use a vaporizer if you are allergic to mold. °HOME CARE INSTRUCTIONS °· Follow the package instructions for the vaporizer. °· Do not use anything other than distilled water in the vaporizer. °· Do not run the vaporizer all of the time. This can cause mold or bacteria to grow in the vaporizer. °· Clean the vaporizer after each time it is used. °· Clean and dry the vaporizer well before storing it. °· Stop using the vaporizer if worsening respiratory symptoms develop. °  °This information is not intended to replace advice given to you by your health care provider. Make sure you discuss any questions you have with your health care provider. °  °Document Released: 06/04/2004 Document Revised: 09/12/2013 Document Reviewed: 01/25/2013 °Elsevier Interactive Patient  Education ©2016 Elsevier Inc. ° °

## 2015-12-11 ENCOUNTER — Emergency Department
Admission: EM | Admit: 2015-12-11 | Discharge: 2015-12-11 | Disposition: A | Discharge status: Home / Self Care | Payer: BLUE CROSS/BLUE SHIELD | Source: Home / Self Care | Attending: Family Medicine | Admitting: Family Medicine

## 2015-12-11 ENCOUNTER — Encounter: Payer: Self-pay | Admitting: *Deleted

## 2015-12-11 ENCOUNTER — Emergency Department (INDEPENDENT_AMBULATORY_CARE_PROVIDER_SITE_OTHER): Payer: BLUE CROSS/BLUE SHIELD

## 2015-12-11 DIAGNOSIS — M940 Chondrocostal junction syndrome [Tietze]: Secondary | ICD-10-CM | POA: Diagnosis not present

## 2015-12-11 DIAGNOSIS — R0781 Pleurodynia: Secondary | ICD-10-CM | POA: Diagnosis not present

## 2015-12-11 DIAGNOSIS — R05 Cough: Secondary | ICD-10-CM | POA: Diagnosis not present

## 2015-12-11 NOTE — ED Notes (Signed)
Pt was seen 11/30/15 for cough. she felt better after taking ABT, but has still continued to have a cough. She c/o RT rib area sharp pain with pressure and tightness x 12/07/15.

## 2015-12-11 NOTE — Discharge Instructions (Signed)
Apply ice pack for 20 to 30 minutes, 3 to 4 times daily  Continue until pain decreases.  May take Ibuprofen 200mg , 4 tabs every 8 hours with food.    Costochondritis Costochondritis, sometimes called Tietze syndrome, is a swelling and irritation (inflammation) of the tissue (cartilage) that connects your ribs with your breastbone (sternum). It causes pain in the chest and rib area. Costochondritis usually goes away on its own over time. It can take up to 6 weeks or longer to get better, especially if you are unable to limit your activities. CAUSES  Some cases of costochondritis have no known cause. Possible causes include:  Injury (trauma).  Exercise or activity such as lifting.  Severe coughing. SIGNS AND SYMPTOMS  Pain and tenderness in the chest and rib area.  Pain that gets worse when coughing or taking deep breaths.  Pain that gets worse with specific movements. DIAGNOSIS  Your health care provider will do a physical exam and ask about your symptoms. Chest X-rays or other tests may be done to rule out other problems. TREATMENT  Costochondritis usually goes away on its own over time. Your health care provider may prescribe medicine to help relieve pain. HOME CARE INSTRUCTIONS   Avoid exhausting physical activity. Try not to strain your ribs during normal activity. This would include any activities using chest, abdominal, and side muscles, especially if heavy weights are used.  Apply ice to the affected area for the first 2 days after the pain begins.  Put ice in a plastic bag.  Place a towel between your skin and the bag.  Leave the ice on for 20 minutes, 2-3 times a day.  Only take over-the-counter or prescription medicines as directed by your health care provider. SEEK MEDICAL CARE IF:  You have redness or swelling at the rib joints. These are signs of infection.  Your pain does not go away despite rest or medicine. SEEK IMMEDIATE MEDICAL CARE IF:   Your pain  increases or you are very uncomfortable.  You have shortness of breath or difficulty breathing.  You cough up blood.  You have worse chest pains, sweating, or vomiting.  You have a fever or persistent symptoms for more than 2-3 days.  You have a fever and your symptoms suddenly get worse. MAKE SURE YOU:   Understand these instructions.  Will watch your condition.  Will get help right away if you are not doing well or get worse.   This information is not intended to replace advice given to you by your health care provider. Make sure you discuss any questions you have with your health care provider.   Document Released: 06/17/2005 Document Revised: 06/28/2013 Document Reviewed: 04/11/2013 Elsevier Interactive Patient Education [employer]2016 [employer] Inc.

## 2015-12-11 NOTE — ED Provider Notes (Signed)
CSN: [id number]     Arrival date & time 12/11/15  1806 History   First MD Initiated Contact with Patient 12/11/15 1848     Chief Complaint  Patient presents with  . Chest Pain      HPI Comments: Patient was treated for a URI on 11/30/15.  Angela Floyd reports that Angela Floyd felt better after finishing her antibiotic, but has had a persistent cough.  During the past four days Angela Floyd has developed sharp pleuritic pain in her right rib area and tightness in her anterior chest.  No fevers, chills, and sweats.  The history is provided by the patient.    History reviewed. No pertinent past medical history. Past Surgical History  Procedure Laterality Date  . Knee arthroscopy Left    Family History  Problem Relation Age of Onset  . Hyperlipidemia Father   . Hypertension Father   . Alcohol abuse Other    Social History  Substance Use Topics  . Smoking status: Never Smoker   . Smokeless tobacco: None  . Alcohol Use: No   OB History    No data available     Review of Systems No sore throat + cough + pleuritic pain No wheezing No nasal congestion No post-nasal drainage No sinus pain/pressure No itchy/red eyes No earache No hemoptysis No SOB No fever/chills No nausea No vomiting No abdominal pain No diarrhea No urinary symptoms No skin rash + fatigue No myalgias No headache Used OTC meds without relief  Allergies  Review of patient's allergies indicates no known allergies.  Home Medications   Prior to Admission medications   Medication Sig Start Date End Date Taking? Authorizing Provider  FLUoxetine (PROZAC) 20 MG capsule  tablet.  Take 1/2 tab by mouth once a day for one week, then increase to whole tab daily. 12/10/10   Agapito Games, MD  Norgestim-Eth Charlott Holler Triphasic (ORTHO TRI-CYCLEN, 28,) 0.18/0.215/0.25 MG-35 MCG TABS Take 1 tablet by mouth daily.      Historical Provider, MD   Meds Ordered and Administered this Visit  Medications - No data to display  BP 102/64  mmHg  Pulse 65  Temp(Src) 98.2 F (36.8 C) (Oral)  Resp 16  Ht  (1.575 m)  Wt 118 lb (53.524 kg)  BMI 21.58 kg/m2  SpO2 98%  LMP 11/30/2015 No data found.   Physical Exam  Constitutional: Angela Floyd is oriented to person, place, and time. Angela Floyd appears well-developed and well-nourished. No distress.  HENT:  Head: Normocephalic.  Right Ear: Tympanic membrane and ear canal normal.  Left Ear: Tympanic membrane and ear canal normal.  Nose: Nose normal.  Mouth/Throat: Oropharynx is clear and moist.  Eyes: Conjunctivae are normal. Pupils are equal, round, and reactive to light.  Neck: Neck supple.  Cardiovascular: Normal heart sounds.   Pulmonary/Chest: Breath sounds normal. No respiratory distress. Angela Floyd has no wheezes. Angela Floyd has no rales. Angela Floyd exhibits tenderness.    Right anterior chest and sternum have distinct tenderness to palpation as noted on diagram.  Palpation there recreates her pain.    Abdominal: There is no tenderness.  Musculoskeletal: Angela Floyd exhibits no edema or tenderness.  Lymphadenopathy:    Angela Floyd has no cervical adenopathy.  Neurological: Angela Floyd is alert and oriented to person, place, and time.  Skin: Skin is warm and dry. No rash noted.  Nursing note and vitals reviewed.   ED Course  Procedures none    Imaging Review Dg Ribs Unilateral W/chest Right  12/11/2015  CLINICAL DATA:  Right rib  pain under the breast. Cough, chest tightness, and pain on inspiration for 4 days. Nonsmoker. EXAM: RIGHT RIBS AND CHEST - 3+ VIEW COMPARISON:  None. FINDINGS: Normal heart size and pulmonary vascularity. No focal airspace disease or consolidation in the lungs. No blunting of costophrenic angles. No pneumothorax. Mediastinal contours appear intact. Right ribs appear intact. No acute displaced fractures or focal bone lesions identified. IMPRESSION: No evidence of active pulmonary disease.  Negative right ribs. Electronically Signed   By: Burman NievesWilliam  Stevens M.D.   On: 12/11/2015 18:57         MDM   1. Costochondritis     Apply ice pack for 20 to 30 minutes, 3 to 4 times daily  Continue until pain decreases.  May take Ibuprofen 200mg , 4 tabs every 8 hours with food.     Lattie HawStephen A Zyon Rosser, MD 12/19/15 [id number]

## 2016-05-29 IMAGING — CR DG RIBS W/ CHEST 3+V*R*
5 series · 5 of 5 positions shown · non-contrast
Comparison: None.

CLINICAL DATA: Right rib pain under the breast. Cough, chest
tightness, and pain on inspiration for 4 days. Nonsmoker.

EXAM:
RIGHT RIBS AND CHEST - 3+ VIEW

[chest pa]
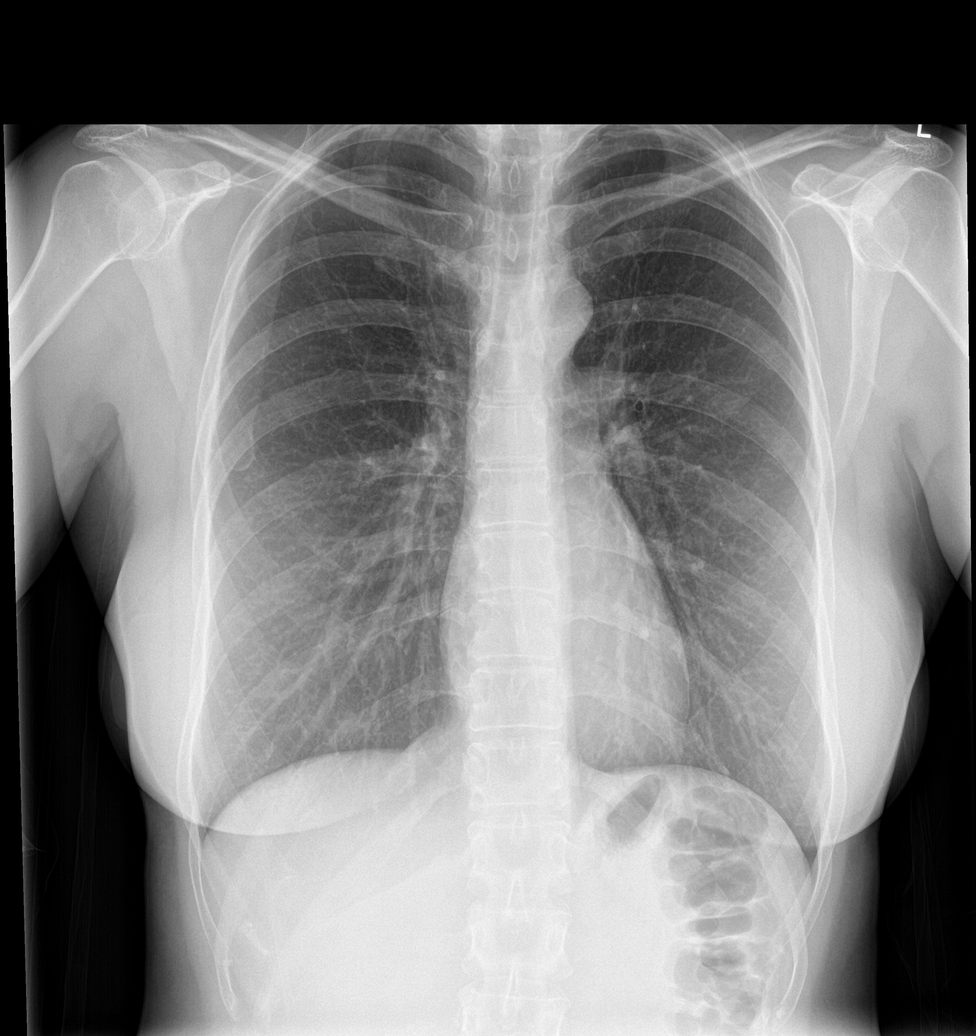

[rib pa (1 of 2)]
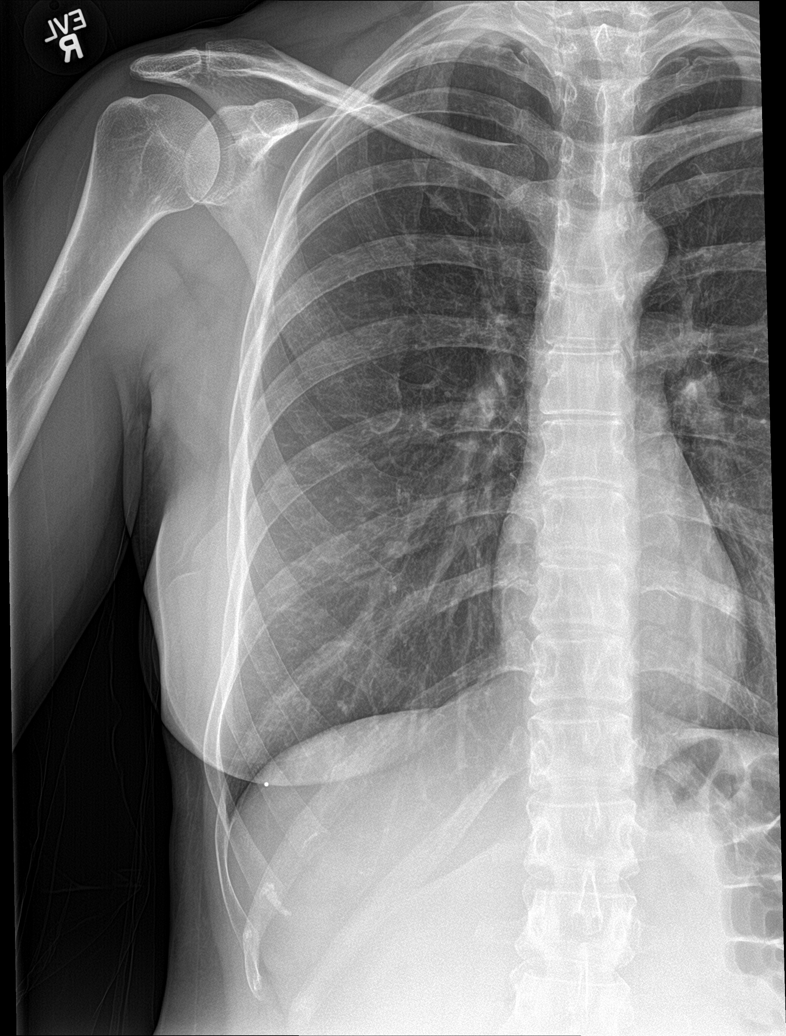

[rib pa (2 of 2)]
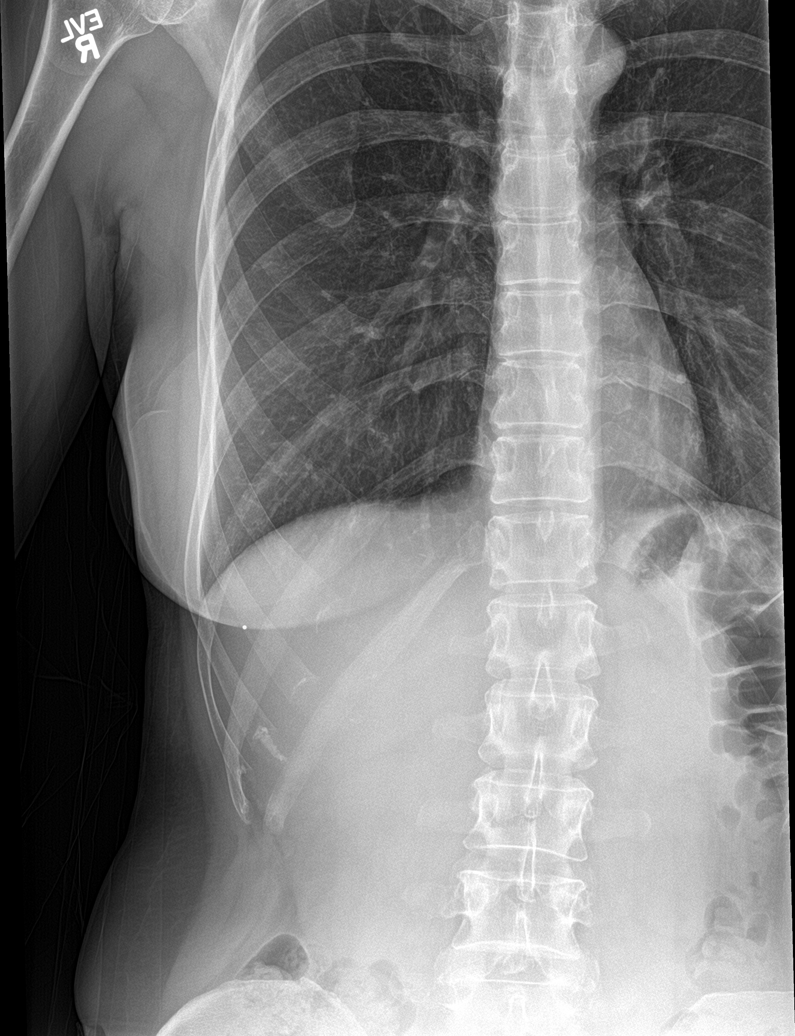

[rib pa obl (1 of 2)]
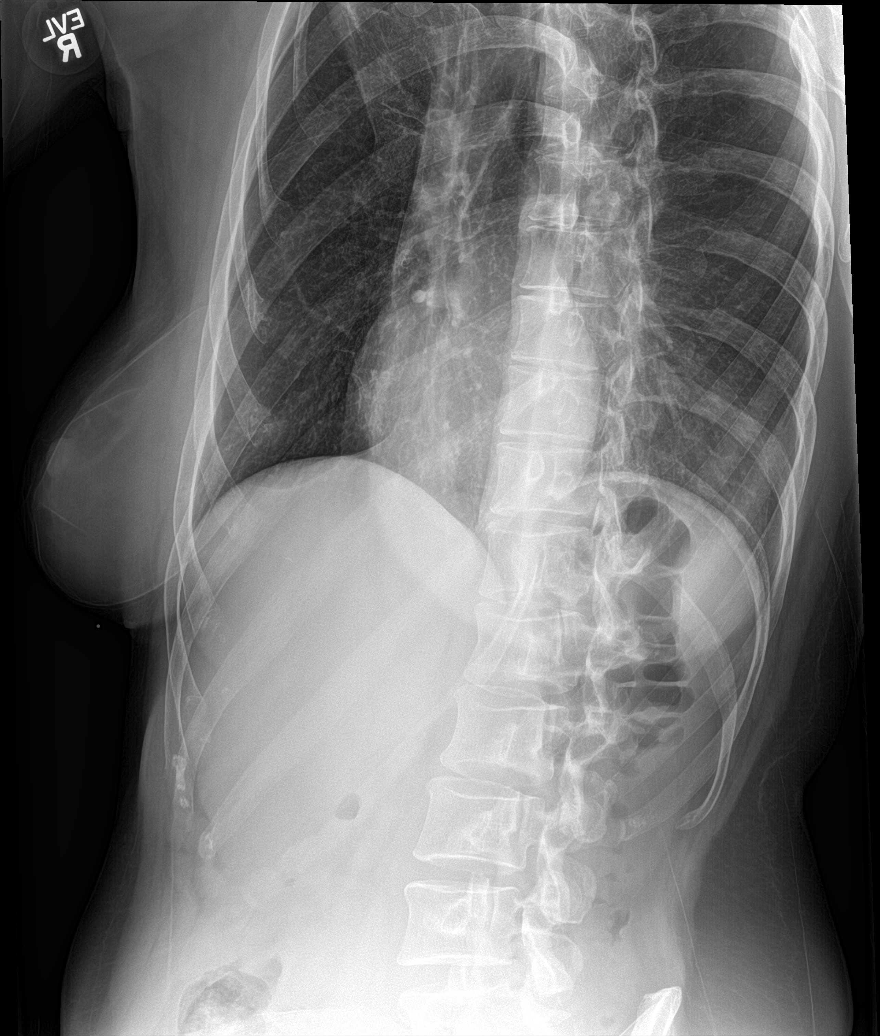

[rib pa obl (2 of 2)]
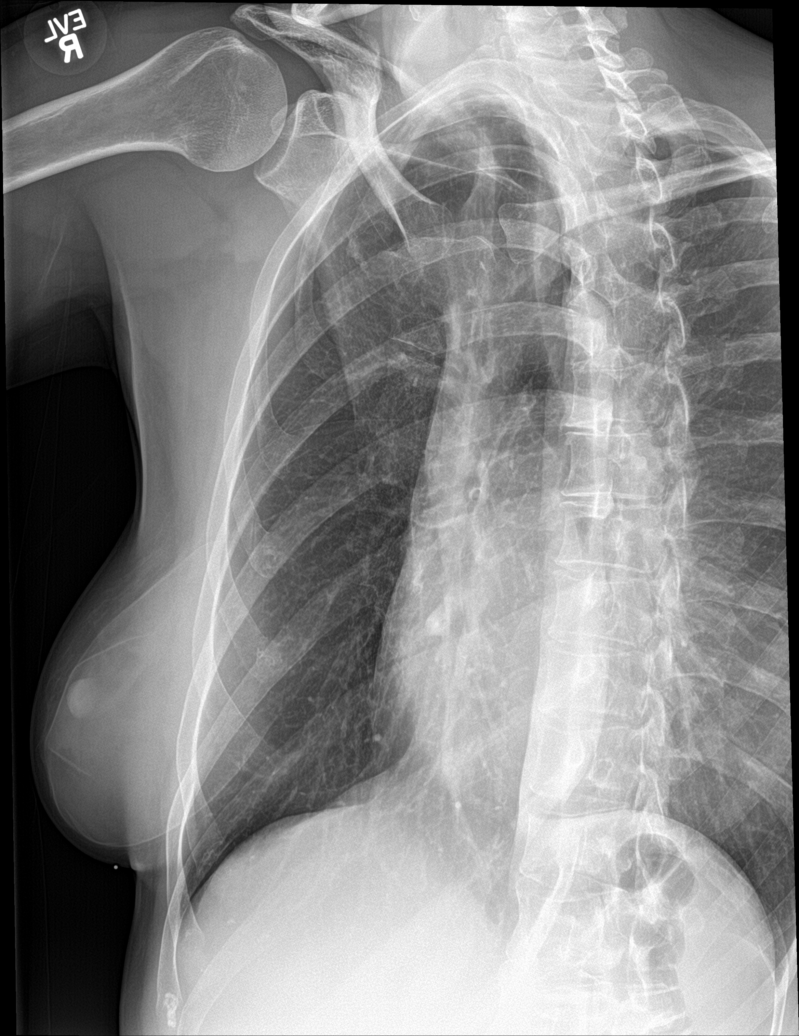

[5 of 5 positions shown; findings below may reference images not displayed]

FINDINGS: Normal heart size and pulmonary vascularity. No focal airspace
disease or consolidation in the lungs. No blunting of costophrenic
angles. No pneumothorax. Mediastinal contours appear intact.

Right ribs appear intact. No acute displaced fractures or focal bone
lesions identified.
IMPRESSION: No evidence of active pulmonary disease.  Negative right ribs.

## 2017-01-27 ENCOUNTER — Emergency Department
Admission: EM | Admit: 2017-01-27 | Discharge: 2017-01-27 | Disposition: A | Discharge status: Home / Self Care | Payer: BLUE CROSS/BLUE SHIELD | Source: Home / Self Care | Attending: Family Medicine | Admitting: Family Medicine

## 2017-01-27 ENCOUNTER — Encounter: Payer: Self-pay | Admitting: *Deleted

## 2017-01-27 DIAGNOSIS — J069 Acute upper respiratory infection, unspecified: Secondary | ICD-10-CM

## 2017-01-27 DIAGNOSIS — J040 Acute laryngitis: Secondary | ICD-10-CM

## 2017-01-27 DIAGNOSIS — B9789 Other viral agents as the cause of diseases classified elsewhere: Secondary | ICD-10-CM | POA: Diagnosis not present

## 2017-01-27 HISTORY — DX: Asymptomatic menopausal state: Z78.0

## 2017-01-27 NOTE — ED Provider Notes (Signed)
CSN: [id number]     Arrival date & time 01/27/17  0846 History   First MD Initiated Contact with Patient 01/27/17 0920     Chief Complaint  Patient presents with  . Cough  . Hoarse   (Consider location/radiation/quality/duration/timing/severity/associated sxs/prior Treatment) HPI Angela Floyd is a 51 y.o. female presenting to UC with c/o 4 days of nasal congestion and cough that started after she swept pollen off her deck.  She was not wearing a mask because she does not usually have seasonal allergies.  She reports getting into a "coughing fit" and has had a cough and hoarse voice since then.  Small amounts of yellow-green sputum.  Denies fever, chills, n/v/d. Denies hx of asthma. No known sick contacts. She notes she is going away at the end of the week to go zip-lining in [place].  She has taken generic Dayquil/Nyquil with minimal relief.    Past Medical History:  Diagnosis Date  . Menopause    Past Surgical History:  Procedure Laterality Date  . KNEE ARTHROSCOPY Left    Family History  Problem Relation Age of Onset  . Alcohol abuse Other   . Hyperlipidemia Father   . Hypertension Father    Social History  Substance Use Topics  . Smoking status: Never Smoker  . Smokeless tobacco: Never Used  . Alcohol use No   OB History    No data available     Review of Systems  Constitutional: Negative for chills and fever.  HENT: Positive for congestion and postnasal drip. Negative for ear pain, sore throat, trouble swallowing and voice change.   Respiratory: Positive for cough. Negative for shortness of breath.   Cardiovascular: Negative for chest pain and palpitations.  Gastrointestinal: Negative for abdominal pain, diarrhea, nausea and vomiting.  Musculoskeletal: Negative for arthralgias, back pain and myalgias.  Skin: Negative for rash.    Allergies  Patient has no known allergies.  Home Medications   Prior to Admission medications   Medication Sig Start Date End Date  Taking? Authorizing Provider  conjugated estrogens (PREMARIN) vaginal cream Place 1 Applicatorful vaginally daily.   Yes [provider]  Nutritional Supplements (DHEA PO) Take by mouth.   Yes [provider]  Progesterone Micronized (PROGESTERONE PO) Take by mouth.   Yes [provider]   Meds Ordered and Administered this Visit  Medications - No data to display  BP 91/61 (BP Location: Left Arm)   Pulse 88   Temp 98.5 F (36.9 C) (Oral)   Resp 14   Wt 115 lb (52.2 kg)   SpO2 97%   BMI 21.03 kg/m  No data found.   Physical Exam  Constitutional: She is oriented to person, place, and time. She appears well-developed and well-nourished. No distress.  HENT:  Head: Normocephalic and atraumatic.  Right Ear: Tympanic membrane normal.  Left Ear: Tympanic membrane normal.  Nose: Nose normal.  Mouth/Throat: Uvula is midline, oropharynx is clear and moist and mucous membranes are normal.  Eyes: EOM are normal.  Neck: Normal range of motion. Neck supple.  Hoarse voice, no stridor.  Cardiovascular: Normal rate and regular rhythm.   Pulmonary/Chest: Effort normal and breath sounds normal. No stridor. No respiratory distress. She has no wheezes. She has no rales.  Musculoskeletal: Normal range of motion.  Lymphadenopathy:    She has no cervical adenopathy.  Neurological: She is alert and oriented to person, place, and time.  Skin: Skin is warm and dry. She is not diaphoretic.  Psychiatric:  She has a normal mood and affect. Her behavior is normal.  Nursing note and vitals reviewed.   Urgent Care Course     Procedures (including critical care time)  Labs Review Labs Reviewed - No data to display  Imaging Review No results found.   MDM   1. Viral URI with cough   2. Laryngitis    Hx and exam c/w viral illness. Encouraged symptomatic treatment. Offered prednisone to help with cough and voice. Pt declined.  Encouraged to continue sinus rinses.   f/u with PCP in 1 week if not improving.     Junius FinnerO'Malley, Chanse Kagel, PA-C 01/27/17 1025

## 2017-01-27 NOTE — ED Triage Notes (Signed)
4 days ago patient swept pollen off deck. She developed a "coughing fit". Since, she c/o cough productive with green sputum and hoarseness. Afebrile. Taken Generic day/night sinus.

## 2017-11-14 ENCOUNTER — Emergency Department
Admission: EM | Admit: 2017-11-14 | Discharge: 2017-11-14 | Disposition: A | Discharge status: Home / Self Care | Payer: BLUE CROSS/BLUE SHIELD | Source: Home / Self Care

## 2017-11-14 ENCOUNTER — Encounter: Payer: Self-pay | Admitting: Emergency Medicine

## 2017-11-14 DIAGNOSIS — J209 Acute bronchitis, unspecified: Secondary | ICD-10-CM | POA: Diagnosis not present

## 2017-11-14 MED ORDER — BENZONATATE 100 MG PO CAPS: 100.0000 mg | ORAL_CAPSULE | Freq: Three times a day (TID) | ORAL | 0 refills | Status: DC

## 2017-11-14 MED ORDER — AZITHROMYCIN 250 MG PO TABS: 250.0000 mg | ORAL_TABLET | Freq: Every day | ORAL | 0 refills | Status: DC

## 2017-11-14 NOTE — ED Triage Notes (Signed)
Patient complaining of non-productive cough, chest tightness, runny nose is clear.

## 2017-11-14 NOTE — Discharge Instructions (Signed)
See your Physician if not improving in 3-4 days

## 2017-11-16 NOTE — ED Provider Notes (Signed)
[facility]    CSN: [id number] Arrival date & time: 11/14/17  1121     History   Chief Complaint Chief Complaint  Patient presents with  . Cough    HPI Angela Floyd is a 52 y.o. female.   The history is provided by the patient. No language interpreter was used.  Cough  Cough characteristics:  Productive Sputum characteristics:  Manson PasseyBrown Severity:  Moderate Duration:  1 week Timing:  Constant Progression:  Worsening Chronicity:  New Smoker: no   Relieved by:  Nothing Worsened by:  Nothing Ineffective treatments:  None tried Associated symptoms: no chest pain     Past Medical History:  Diagnosis Date  . Menopause     Patient Active Problem List   Diagnosis Date Noted  . ACUTE PHARYNGITIS 06/05/2011  . ACUTE LARYNGITIS, WITHOUT MENTION OF OBSTRUCTIO 06/05/2011  . GAD (generalized anxiety disorder) 12/10/2010  . GASTRITIS 07/23/2010  . LATERAL EPICONDYLITIS 07/23/2010    Past Surgical History:  Procedure Laterality Date  . KNEE ARTHROSCOPY Left     OB History    No data available       Home Medications    Prior to Admission medications   Medication Sig Start Date End Date Taking? Authorizing Provider  azithromycin (ZITHROMAX) 250 MG tablet Take 1 tablet (250 mg total) by mouth daily. Take first 2 tablets together, then 1 every day until finished. 11/14/17   Elson AreasSofia, Arcangel Minion K, PA-C  benzonatate (TESSALON) 100 MG capsule Take 1 capsule (100 mg total) by mouth every 8 (eight) hours. 11/14/17   Elson AreasSofia, Anay Walter K, PA-C  conjugated estrogens (PREMARIN) vaginal cream Place 1 Applicatorful vaginally daily.    [provider]  Nutritional Supplements (DHEA PO) Take by mouth.    [provider]  Progesterone Micronized (PROGESTERONE PO) Take by mouth.    [provider]    Family History Family History  Problem Relation Age of Onset  . Alcohol abuse Other   . Hyperlipidemia Father   . Hypertension Father     Social  History Social History   Tobacco Use  . Smoking status: Never Smoker  . Smokeless tobacco: Never Used  Substance Use Topics  . Alcohol use: No  . Drug use: No     Allergies   Codeine   Review of Systems Review of Systems  Respiratory: Positive for cough.   Cardiovascular: Negative for chest pain.  All other systems reviewed and are negative.    Physical Exam Triage Vital Signs ED Triage Vitals [11/14/17 1146]  Enc Vitals Group     BP 119/72     Pulse Rate 85     Resp      Temp 98.2 F (36.8 C)     Temp Source Oral     SpO2 96 %     Weight 121 lb (54.9 kg)     Height 5\' 2"  (1.575 m)     Head Circumference      Peak Flow      Pain Score 0     Pain Loc      Pain Edu?      Excl. in GC?    No data found.  Updated Vital Signs BP 119/72 (BP Location: Right Arm)   Pulse 85   Temp 98.2 F (36.8 C) (Oral)   Ht 5\' 2"  (1.575 m)   Wt 121 lb (54.9 kg)   SpO2 96%   BMI 22.13 kg/m   Visual Acuity Right Eye Distance:  Left Eye Distance:   Bilateral Distance:    Right Eye Near:   Left Eye Near:    Bilateral Near:     Physical Exam  Constitutional: She appears well-developed and well-nourished. No distress.  HENT:  Head: Normocephalic and atraumatic.  Right Ear: External ear normal.  Left Ear: External ear normal.  Eyes: Conjunctivae are normal.  Neck: Neck supple.  Cardiovascular: Normal rate and regular rhythm.  No murmur heard. Pulmonary/Chest: Effort normal and breath sounds normal. No respiratory distress.  Abdominal: There is no tenderness.  Musculoskeletal: She exhibits no edema.  Neurological: She is alert.  Skin: Skin is warm and dry.  Psychiatric: She has a normal mood and affect.  Nursing note and vitals reviewed.    UC Treatments / Results  Labs (all labs ordered are listed, but only abnormal results are displayed) Labs Reviewed - No data to display  EKG  EKG Interpretation None       Radiology No results  found.  Procedures Procedures (including critical care time)  Medications Ordered in UC Medications - No data to display   Initial Impression / Assessment and Plan / UC Course  I have reviewed the triage vital signs and the nursing notes.  Pertinent labs & imaging results that were available during my care of the patient were reviewed by me and considered in my medical decision making (see chart for details).    MDM  I will treat with Tessalon and zithromax.  Pt advised to see her MD for recheck in 2-3 days.  Final Clinical Impressions(s) / UC Diagnoses   Final diagnoses:  Acute bronchitis, unspecified organism    ED Discharge Orders        Ordered    azithromycin (ZITHROMAX) 250 MG tablet  Daily     11/14/17 1207    benzonatate (TESSALON) 100 MG capsule  Every 8 hours     11/14/17 1207     An After Visit Summary was printed and given to the patient.   Controlled Substance Prescriptions [place] Controlled Substance Registry consulted? Not Applicable   Elson Areas, [place] 11/16/17 1610

## 2018-02-27 ENCOUNTER — Other Ambulatory Visit: Payer: Self-pay

## 2018-02-27 ENCOUNTER — Emergency Department (INDEPENDENT_AMBULATORY_CARE_PROVIDER_SITE_OTHER)
Admission: EM | Admit: 2018-02-27 | Discharge: 2018-02-27 | Disposition: A | Discharge status: Home / Self Care | Payer: BLUE CROSS/BLUE SHIELD | Source: Home / Self Care | Attending: Family Medicine | Admitting: Family Medicine

## 2018-02-27 DIAGNOSIS — B9789 Other viral agents as the cause of diseases classified elsewhere: Secondary | ICD-10-CM | POA: Diagnosis not present

## 2018-02-27 DIAGNOSIS — J069 Acute upper respiratory infection, unspecified: Secondary | ICD-10-CM

## 2018-02-27 DIAGNOSIS — H6591 Unspecified nonsuppurative otitis media, right ear: Secondary | ICD-10-CM

## 2018-02-27 MED ORDER — AMOXICILLIN 875 MG PO TABS: 875.0000 mg | ORAL_TABLET | Freq: Two times a day (BID) | ORAL | 0 refills | Status: AC

## 2018-02-27 NOTE — ED Provider Notes (Signed)
[facility]    CSN: [id number] Arrival date & time: 02/27/18  1110     History   Chief Complaint Chief Complaint  Patient presents with  . Nasal Congestion  . Cough    HPI Angela Floyd is a 52 y.o. female.   One week ago while in [place], patient developed typical cold-like symptoms developing over several days, including sneezing, mild sore throat, sinus congestion, headache, fatigue, and finally cough two days ago.  She developed chills today.  She complains of mild tightness in her anterior chest.   The history is provided by the patient.    Past Medical History:  Diagnosis Date  . Menopause     Patient Active Problem List   Diagnosis Date Noted  . ACUTE PHARYNGITIS 06/05/2011  . ACUTE LARYNGITIS, WITHOUT MENTION OF OBSTRUCTIO 06/05/2011  . GAD (generalized anxiety disorder) 12/10/2010  . GASTRITIS 07/23/2010  . LATERAL EPICONDYLITIS 07/23/2010    Past Surgical History:  Procedure Laterality Date  . KNEE ARTHROSCOPY Left     OB History   None      Home Medications    Prior to Admission medications   Medication Sig Start Date End Date Taking? Authorizing Provider  amoxicillin (AMOXIL) 875 MG tablet Take 1 tablet (875 mg total) by mouth 2 (two) times daily. 02/27/18   Lattie Haw, MD  conjugated estrogens (PREMARIN) vaginal cream Place 1 Applicatorful vaginally daily.    [provider]  Nutritional Supplements (DHEA PO) Take by mouth.    [provider]  Progesterone Micronized (PROGESTERONE PO) Take by mouth.    [provider]    Family History Family History  Problem Relation Age of Onset  . Alcohol abuse Other   . Hyperlipidemia Father   . Hypertension Father     Social History Social History   Tobacco Use  . Smoking status: Never Smoker  . Smokeless tobacco: Never Used  Substance Use Topics  . Alcohol use: No  . Drug use: No     Allergies   Codeine   Review of Systems Review of  Systems + sore throat + cough No pleuritic pain, but has tightness in anterior chest. No wheezing + nasal congestion + post-nasal drainage No sinus pain/pressure No itchy/red eyes ? earache No hemoptysis No SOB No fever, + chills No nausea No vomiting No abdominal pain No diarrhea No urinary symptoms No skin rash + fatigue + myalgias No headache Used OTC meds without relief   Physical Exam Triage Vital Signs ED Triage Vitals [02/27/18 1123]  Enc Vitals Group     BP 101/67     Pulse Rate 84     Resp 18     Temp 97.8 F (36.6 C)     Temp Source Oral     SpO2 98 %     Weight 117 lb (53.1 kg)     Height 5\' 2"  (1.575 m)     Head Circumference      Peak Flow      Pain Score 0     Pain Loc      Pain Edu?      Excl. in GC?    No data found.  Updated Vital Signs BP 101/67 (BP Location: Right Arm)   Pulse 84   Temp 97.8 F (36.6 C) (Oral)   Resp 18   Ht 5\' 2"  (1.575 m)   Wt 117 lb (53.1 kg)   SpO2 98%   BMI 21.40 kg/m  Visual Acuity Right Eye Distance:   Left Eye Distance:   Bilateral Distance:    Right Eye Near:   Left Eye Near:    Bilateral Near:     Physical Exam Nursing notes and Vital Signs reviewed. Appearance:  Patient appears stated age, and in no acute distress Eyes:  Pupils are equal, round, and reactive to light and accomodation.  Extraocular movement is intact.  Conjunctivae are not inflamed  Ears:  Canals normal.  Right tympanic membrane erythematous; left tympanic membrane retracted. Nose:  Mildly congested turbinates.  No sinus tenderness.    Pharynx:  Normal Neck:  Supple.  Enlarged posterior/lateral nodes are palpated bilaterally, tender to palpation on the left.   Lungs:  Clear to auscultation.  Breath sounds are equal.  Moving air well. Chest:  No tenderness to palpation  Heart:  Regular rate and rhythm without murmurs, rubs, or gallops.  Abdomen:  Nontender without masses or hepatosplenomegaly.  Bowel sounds are present.  No CVA  or flank tenderness.  Extremities:  No edema.  Skin:  No rash present.    UC Treatments / Results  Labs (all labs ordered are listed, but only abnormal results are displayed) Labs Reviewed - No data to display  EKG None  Radiology No results found.  Procedures Procedures (including critical care time)  Medications Ordered in UC Medications - No data to display  Initial Impression / Assessment and Plan / UC Course  I have reviewed the triage vital signs and the nursing notes.  Pertinent labs & imaging results that were available during my care of the patient were reviewed by me and considered in my medical decision making (see chart for details).    Begin amoxicillin. Followup with Family Doctor if not improved in about 10 days.   Final Clinical Impressions(s) / UC Diagnoses   Final diagnoses:  Viral URI with cough  Right otitis media with effusion     Discharge Instructions     Take plain guaifenesin (1200mg  extended release tabs such as Mucinex) twice daily, with plenty of water, for cough and congestion.  May add Pseudoephedrine (30mg , one or two every 4 to 6 hours) for sinus congestion.  Get adequate rest.   May use Afrin nasal spray (or generic oxymetazoline) each morning for about 5 days and then discontinue.  Also recommend using saline nasal spray several times daily and saline nasal irrigation (AYR is a common brand).  Try warm salt water gargles for sore throat.  May take Delsym Cough Suppressant at bedtime for nighttime cough.  Stop all antihistamines for now, and other non-prescription cough/cold preparations. May take Ibuprofen 200mg , 4 tabs every 8 hours with food for chest/sternum discomfort.      ED Prescriptions    Medication Sig Dispense Auth. Provider   amoxicillin (AMOXIL) 875 MG tablet Take 1 tablet (875 mg total) by mouth 2 (two) times daily. 20 tablet Lattie HawBeese, Stephen A, MD         Lattie HawBeese, Stephen A, MD 02/27/18 [phone]

## 2018-02-27 NOTE — ED Triage Notes (Signed)
Pt c/o cold likes symptoms x 7 days. Started with sneezing, then congestion. Ears are clogged up and now experiencing deep chest cough with yellow sputum.

## 2018-02-27 NOTE — Discharge Instructions (Addendum)
Take plain guaifenesin (1200mg  extended release tabs such as Mucinex) twice daily, with plenty of water, for cough and congestion.  May add Pseudoephedrine (30mg , one or two every 4 to 6 hours) for sinus congestion.  Get adequate rest.   May use Afrin nasal spray (or generic oxymetazoline) each morning for about 5 days and then discontinue.  Also recommend using saline nasal spray several times daily and saline nasal irrigation (AYR is a common brand).  Try warm salt water gargles for sore throat.  May take Delsym Cough Suppressant at bedtime for nighttime cough.  Stop all antihistamines for now, and other non-prescription cough/cold preparations. May take Ibuprofen 200mg , 4 tabs every 8 hours with food for chest/sternum discomfort.
